# Patient Record
Sex: Male | Born: 2000 | Race: Black or African American | Hispanic: No | Marital: Single | Smoking: Never smoker
Health system: Southern US, Community
[De-identification: ages and names within clinical notes are randomized; demographics above are authoritative.]

## PROBLEM LIST (undated history)

## (undated) DIAGNOSIS — J302 Other seasonal allergic rhinitis: Secondary | ICD-10-CM

## (undated) DIAGNOSIS — S92309A Fracture of unspecified metatarsal bone(s), unspecified foot, initial encounter for closed fracture: Secondary | ICD-10-CM

---
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
## 2018-01-09 ENCOUNTER — Ambulatory Visit: Payer: Managed Care, Other (non HMO) | Admitting: Family Medicine

## 2018-12-10 ENCOUNTER — Other Ambulatory Visit: Payer: Self-pay

## 2018-12-10 DIAGNOSIS — Z20822 Contact with and (suspected) exposure to covid-19: Secondary | ICD-10-CM

## 2018-12-12 LAB — NOVEL CORONAVIRUS, NAA: SARS-CoV-2, NAA: NOT DETECTED

## 2019-02-09 ENCOUNTER — Ambulatory Visit: Payer: Managed Care, Other (non HMO) | Attending: Internal Medicine

## 2019-02-09 DIAGNOSIS — Z20822 Contact with and (suspected) exposure to covid-19: Secondary | ICD-10-CM

## 2019-02-10 LAB — NOVEL CORONAVIRUS, NAA: SARS-CoV-2, NAA: NOT DETECTED

## 2019-08-12 ENCOUNTER — Ambulatory Visit: Payer: Managed Care, Other (non HMO) | Attending: Internal Medicine

## 2019-08-12 DIAGNOSIS — Z20822 Contact with and (suspected) exposure to covid-19: Secondary | ICD-10-CM

## 2019-08-13 LAB — NOVEL CORONAVIRUS, NAA: SARS-CoV-2, NAA: NOT DETECTED

## 2019-08-13 LAB — SARS-COV-2, NAA 2 DAY TAT
